# Patient Record
Sex: Female | Born: 1940 | Race: White | Hispanic: No | Smoking: Current every day smoker
Health system: Southern US, Community
[De-identification: ages and names within clinical notes are randomized; demographics above are authoritative.]

## PROBLEM LIST (undated history)

## (undated) DIAGNOSIS — I10 Essential (primary) hypertension: Secondary | ICD-10-CM

## (undated) DIAGNOSIS — I739 Peripheral vascular disease, unspecified: Secondary | ICD-10-CM

## (undated) DIAGNOSIS — I6529 Occlusion and stenosis of unspecified carotid artery: Secondary | ICD-10-CM

## (undated) DIAGNOSIS — I771 Stricture of artery: Secondary | ICD-10-CM

## (undated) DIAGNOSIS — Z720 Tobacco use: Secondary | ICD-10-CM

## (undated) DIAGNOSIS — E785 Hyperlipidemia, unspecified: Secondary | ICD-10-CM

## (undated) HISTORY — DX: Peripheral vascular disease, unspecified: I73.9

## (undated) HISTORY — DX: Essential (primary) hypertension: I10

## (undated) HISTORY — DX: Tobacco use: Z72.0

## (undated) HISTORY — DX: Hyperlipidemia, unspecified: E78.5

## (undated) HISTORY — DX: Occlusion and stenosis of unspecified carotid artery: I65.29

## (undated) HISTORY — DX: Stricture of artery: I77.1

---

## 2006-09-27 HISTORY — PX: OTHER SURGICAL HISTORY: SHX169

## 2006-12-30 ENCOUNTER — Ambulatory Visit (HOSPITAL_COMMUNITY): Admission: RE | Admit: 2006-12-30 | Discharge: 2006-12-30 | Payer: Self-pay | Admitting: Cardiology

## 2008-02-16 ENCOUNTER — Ambulatory Visit: Payer: Self-pay | Admitting: Surgery

## 2008-03-08 ENCOUNTER — Encounter: Admission: RE | Admit: 2008-03-08 | Discharge: 2008-03-08 | Payer: Self-pay | Admitting: Surgery

## 2008-03-08 ENCOUNTER — Ambulatory Visit: Payer: Self-pay | Admitting: Surgery

## 2008-04-01 ENCOUNTER — Encounter: Payer: Self-pay | Admitting: Surgery

## 2008-04-01 ENCOUNTER — Ambulatory Visit: Payer: Self-pay | Admitting: Surgery

## 2008-04-01 ENCOUNTER — Inpatient Hospital Stay (HOSPITAL_COMMUNITY): Admission: RE | Admit: 2008-04-01 | Discharge: 2008-04-08 | Payer: Self-pay | Admitting: Surgery

## 2008-04-01 HISTORY — PX: OTHER SURGICAL HISTORY: SHX169

## 2008-04-26 ENCOUNTER — Ambulatory Visit: Payer: Self-pay | Admitting: Surgery

## 2008-05-17 ENCOUNTER — Ambulatory Visit: Payer: Self-pay | Admitting: Surgery

## 2010-04-26 IMAGING — CT CT ANGIO AOBIFEM WO/W CM
2 of 7 series · 7 of 33 positions shown · IV contrast ([ID] OMNI 350)
Comparison: 12/30/2006

CLINICAL DATA: Right lower extremity claudication.  Previous CTA
demonstrated distal aortic occlusion, reconstituted external iliac
arteries and a right SFA occlusion.

CT ANGIOGRAPHY OF ABDOMINAL AORTA WITH ILIOFEMORAL RUNOFF
TECHNIQUE: Multidetector CT imaging of the abdomen, pelvis and
lower extremities was performed using the standard protocol during
bolus administration of intravenous contrast. Multiplanar CT image
reconstructions were obtained to evaluate the vascular anatomy.
Contrast: 165 ml Omnipaque 350 IV

[Series 5: runoff · axial · 0.78mm/px · z∈[-1068,-178]mm · 6 of 500 slices shown]
[im 72/500  soft-tissue]
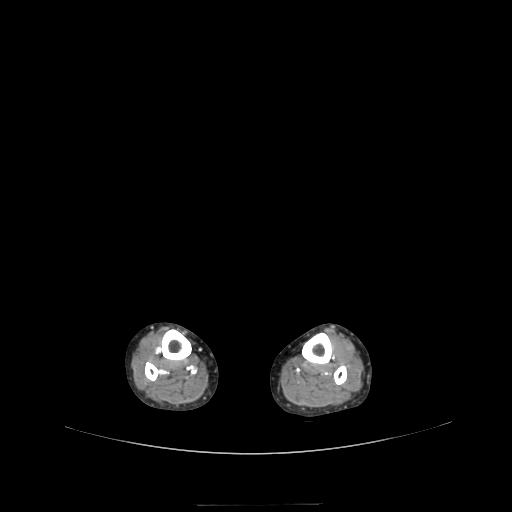
[im 143/500  bone]
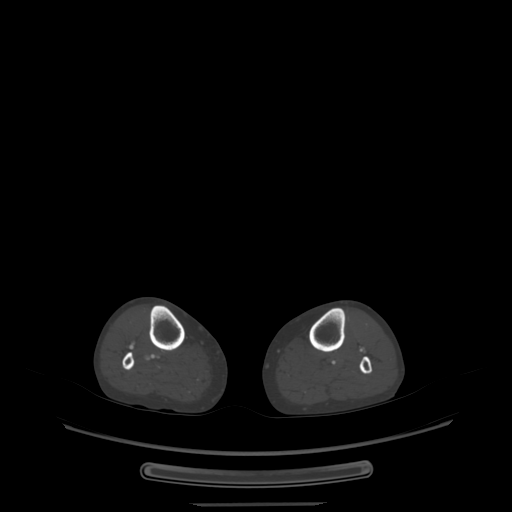
[im 214/500  soft-tissue]
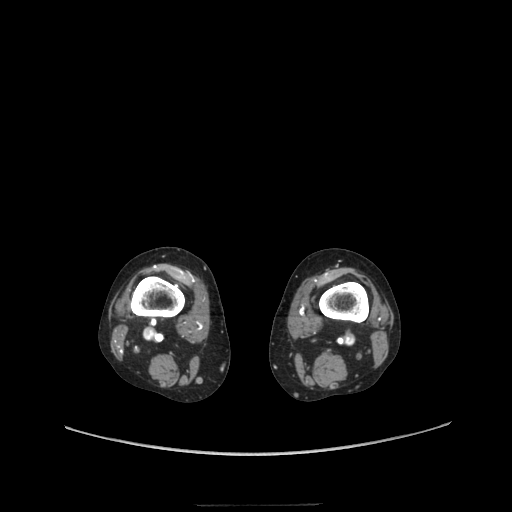
[im 286/500  bone]
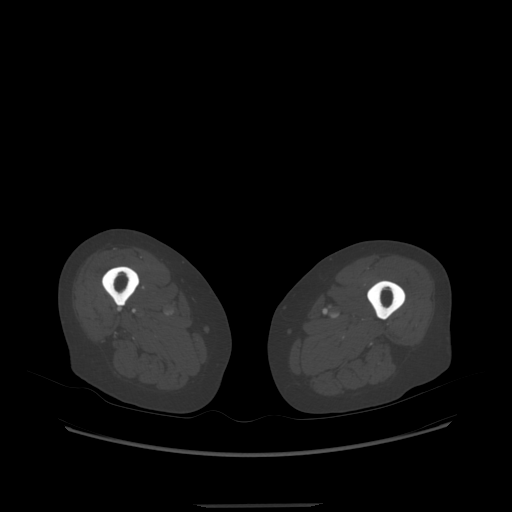
[im 357/500  soft-tissue]
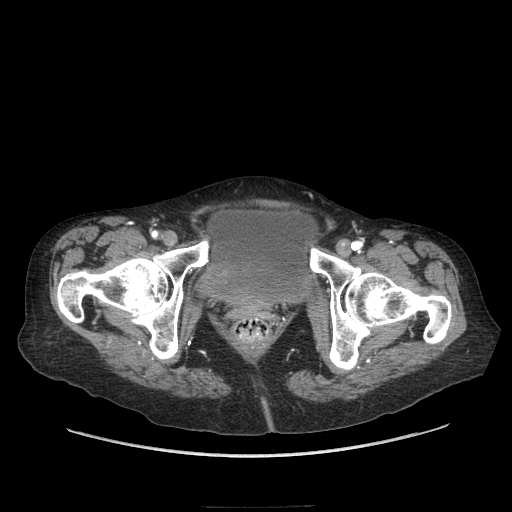
[im 428/500  bone]
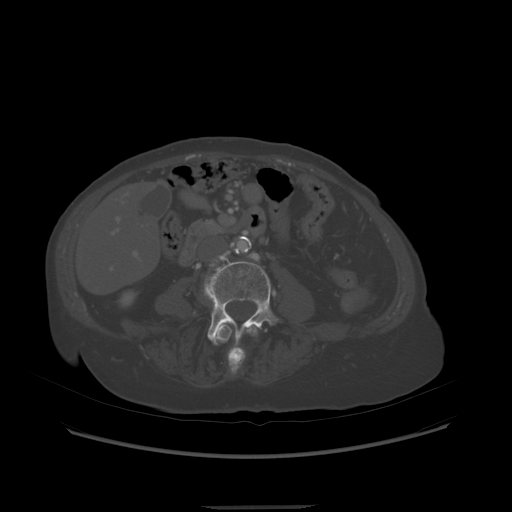

[Series 403: sag/legs · sagittal · 1.81mm/px · 1 of 134 slices shown]
[im 67/134  soft-tissue]
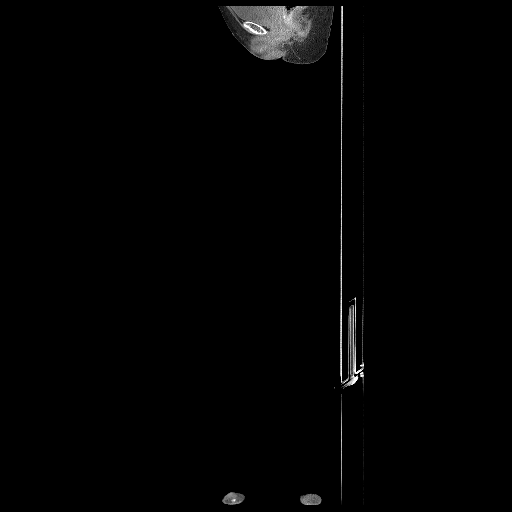

[7 of 33 positions shown; findings below may reference images not displayed]

FINDINGS: Aorta: Diffuse atherosclerotic disease.  No aneurysm.  Distal
aortic occlusion again demonstrated just above the aortic
bifurcation.  Single right renal artery is widely patent.  Heavily
calcified plaque at the origin of the left renal artery causes
approximately 50 - 70 % stenosis of the proximal vessel.  There is
a 70 - 80% stenosis at the origin of the celiac axis.  The superior
mesenteric artery and inferior mesenteric artery show normal
patency.

Right Lower Extermity: The distal external iliac artery is
reconstituted just above the inguinal ligament by prominent
inferior epigastric and circumflex iliac collaterals.  The common
femoral artery shows focal atherosclerotic plaque but no
significant narrowing.  There is stable chronic occlusion at the
origin of the right SFA with distal reconstitution at the abductor
hiatus.  The profunda femoral artery and collateral branches are
normally patent.  The reconstituted distal SFA initially is very
diseased and demonstrates approximately 50 - 70% diffuse stenosis.
The popliteal artery above the knee shows mild disease with an
eccentric 25 - 30% stenosis present.  Across the knee and below the
knee, the popliteal artery shows no significant disease.  Three-
vessel runoff is demonstrated into the foot with distal posterior
tibial and dorsalis pedis arteries of nearly equal caliber.

Left Lower Extremity: Similar level of external iliac
reconstitution just above the inguinal ligament by collateral
vessels.  The SFA is open on the left and demonstrates no
significant disease.  Profunda femoral artery and branches also
show no occlusive disease.  Three-vessel runoff is demonstrated
into the foot with dominant posterior tibial runoff present.  The
anterior tibial artery is open but smaller in caliber.
IMPRESSION: 1.  Stable appearance of chronic distal aortic occlusion.
2.  Stable appearance of reconstituted distal external iliac artery
on the right just above the inguinal ligament.  Chronic SFA
occlusion beginning at origin and continuing to the abductor hiatus
on the right.  Initial segment of reconstituted SFA shows diffuse
disease.  Three-vessel runoff present on the right.
3.  Distal external iliac artery reconstitution on the left just
above the inguinal ligament.  No significant SFA or profunda
femoral disease.  Three-vessel runoff demonstrated on the left.

## 2010-05-21 IMAGING — CR DG CHEST 1V PORT
1 series · 1 of 1 positions shown · non-contrast
Comparison: 1 day prior

CLINICAL DATA: Status post aortic bypass.

PORTABLE CHEST - 1 VIEW

[view not recorded]
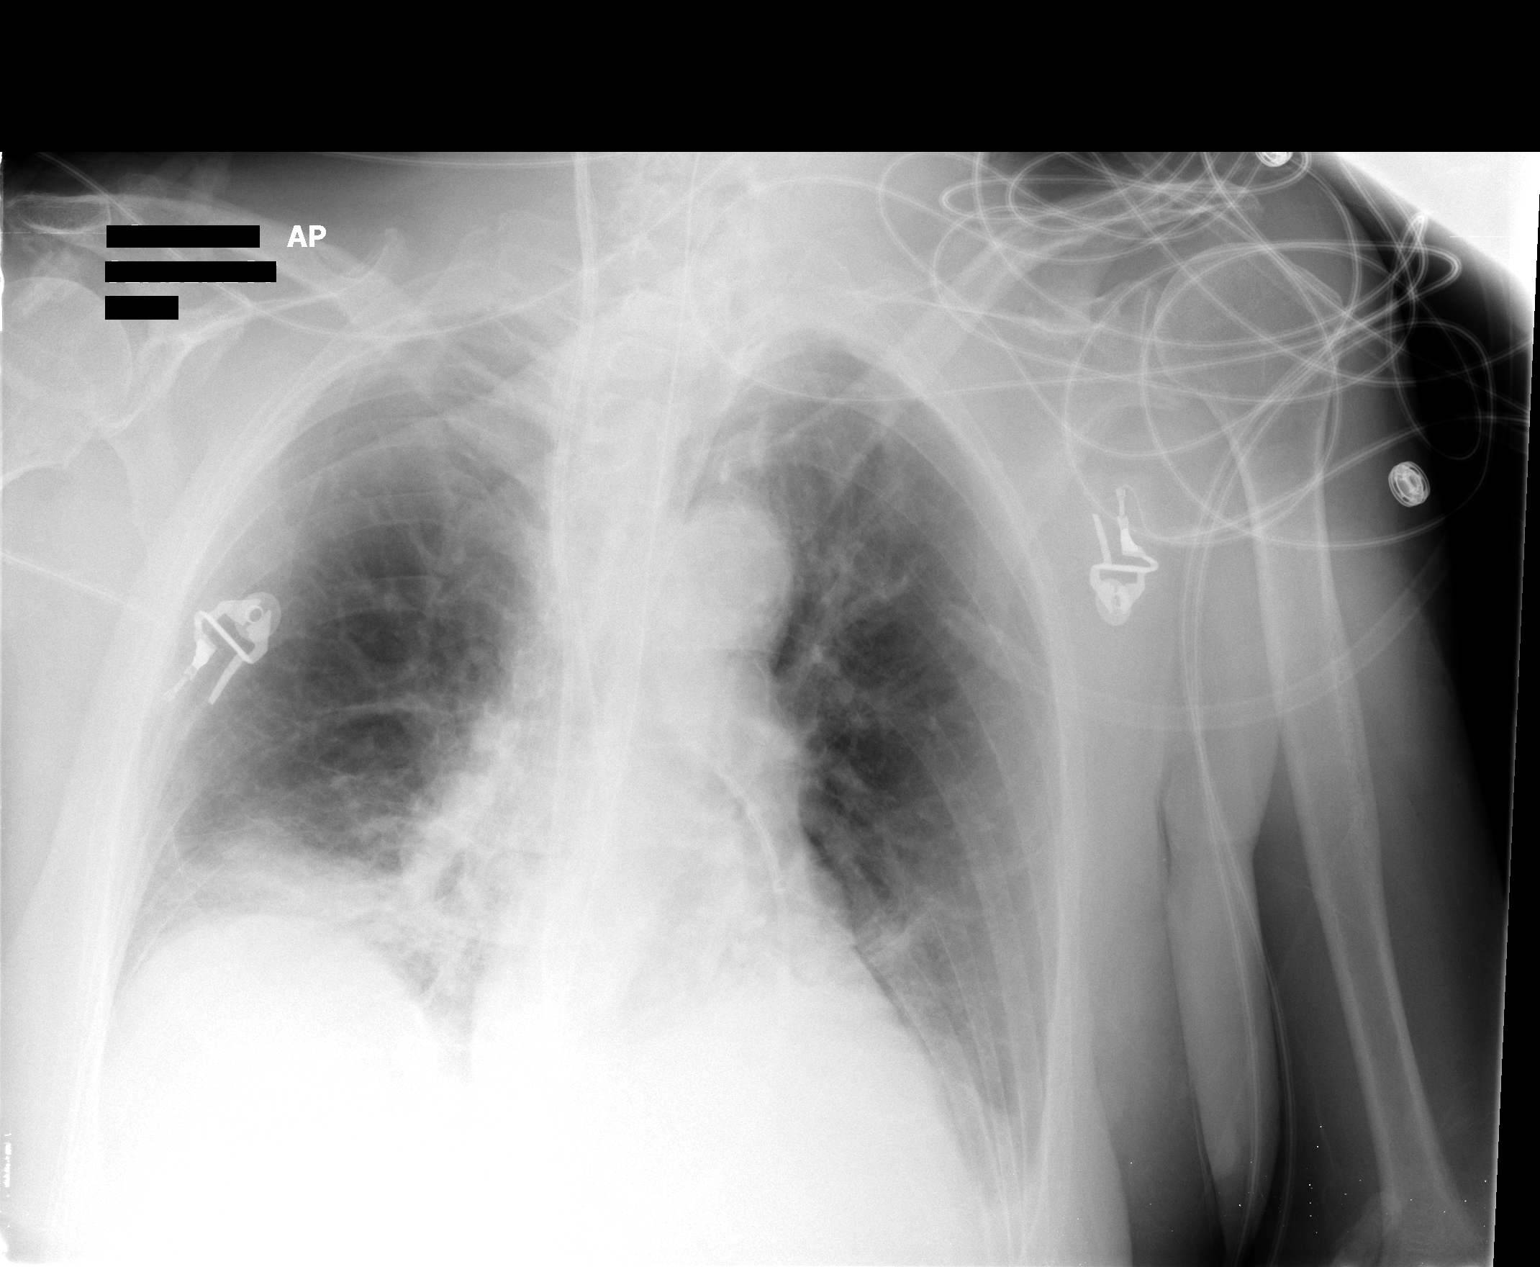

[1 of 1 positions shown; findings below may reference images not displayed]

FINDINGS: Nasogastric tube extends beyond the  inferior aspect of
the film.  Right IJ central line terminates at the low SVC.  Right
IJ Swan-Ganz catheter with tip at proximal right pulmonary artery.

The patient is rotated to the left.  Normal heart size.  Probable
small left pleural effusion. No pneumothorax.  Suspect mild
interstitial edema.  Similar left and increased right base air
space disease.
IMPRESSION: 1.  Slight worsening aeration with increased right base air space
disease which may represent atelectasis or developing infection.
2.  Suspect mild pulmonary venous congestion.
3.  Small left pleural effusion.

## 2010-09-13 HISTORY — PX: OTHER SURGICAL HISTORY: SHX169

## 2011-02-20 NOTE — Assessment & Plan Note (Signed)
OFFICE VISIT   Laura Arnold, Laura Arnold  DOB:  May 13, 1941                                       02/16/2008  [id number]#:[id number]   REASON FOR VISIT:  Aortic occlusion.   HISTORY:  This is a 70 year old female seen at the request of Dr. Yates Decamp for evaluation of bilateral claudication.  The patient had a CT  scan performed over a year ago which reveals infrarenal aortic occlusion  with reconstitution of bilateral common femoral arteries.  Her profunda  femoral arteries are patent.  However, her superficial femoral arteries  were occluded bilaterally.  They reconstituted the popliteal artery.  The patient states that she has had significant lifestyle changes due to  this.  She is very limited in her activity level.  She states that she  claudicates right side greater than left with just minimal ambulation.  She does not have any ulceration or open wounds at this time.  It does  not sound like she complains of rest pain.  However, she does have some  joint pain that keeps her up at night.   From a cardiovascular standpoint Dr. Jacinto Halim is managing her cardiac  issues.  She has an ejection fraction of approximately 78% and will be  cleared for intra-abdominal surgery.   REVIEW OF SYSTEMS:  GENERAL:  Positive for loss of appetite.  CARDIAC:  Negative.  PULMONARY:  Negative.  GI:  Negative.  GU:  Negative.  VASCULAR:  Pain in legs with walking.  NEURO:  Negative.  ORTHO:  Positive for arthritis.  PSYCH:  Positive for nervousness.  ENT:  Negative.  HEME: Negative.   PAST MEDICAL HISTORY:  Hypertension, hypercholesterolemia, tobacco  abuse, hypercholesterolemia.   FAMILY HISTORY:  Noncontributory.   SOCIAL HISTORY:  She is single.  She is retired.  She currently smokes  approximately half a pack a day.  She drinks approximately two glass of  wine per day.   MEDICATIONS:  Include Plavix 75 mg per day, cilostazol 100 mg twice  daily, ramipril 5 mg nightly,  simvastatin 40 mg nightly, and calcium 600  once a day.   ALLERGIES:  None.   PHYSICAL EXAMINATION:  Vital signs:  Blood pressure is 148/80, heart  rate is 85.  General:  Well-appearing in no acute distress.  HEENT:  Normocephalic, atraumatic.  Pupils are equal.  Sclerae are anicteric.  Neck is supple, no JVD.  Cardiovascular:  Regular.  Respirations  nonlabored.  Abdomen:  Soft, nontender.  No pulsatile mass.  No  hepatosplenomegaly.  Femoral pulses are nonpalpable.  Extremities are  warm and well perfused.  Pedal pulses are not palpable.  Skin is without  rash.  Neuro:  Cranial nerves 2-12 grossly intact.  Psych:  She is alert  and oriented x3.   ASSESSMENT/PLAN:  This is a 70 year old female with bilateral  claudication.   PLAN:  I had a long conversation regarding the risks and benefits of  proceeding with an aortobifemoral bypass graft.  She is still  indeterminate as to whether she wishes to proceed with this.  Regardless, I need further imaging to make sure that her disease has not  progressed over the course of the past year.  I am going to get a CT  angiogram of her abdomen and pelvis with bilateral lower extremity  runoff.  Also going to get a  noncontrasted CT scan of her chest should I  consider coming off her thoracic aorta as her infrarenal abdominal aorta  is heavily calcified.  She is going to follow up with me in 2 weeks,  once these studies have been obtained.  At that time, we will make the  decision as to whether or not she wishes to proceed with surgical  intervention.   Jorge Ny, MD  Electronically Signed   VWB/MEDQ  D:  02/16/2008  T:  02/17/2008  Job:  [id number]   cc:   Cristy Hilts. Jacinto Halim, MD

## 2011-02-20 NOTE — Discharge Summary (Signed)
Laura Arnold, Laura Arnold          ACCOUNT NO.:  [id number]   MEDICAL RECORD NO.:  [id number]          PATIENT TYPE:  INP   LOCATION:  2039                         FACILITY:  [facility]   PHYSICIAN:  Juleen China IV, MDDATE OF BIRTH:  Sep 14, 1941   DATE OF ADMISSION:  04/01/2008  DATE OF DISCHARGE:                               DISCHARGE SUMMARY   ANTICIPATED DATE OF DISCHARGE:  April 07, 2008.   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   DISCHARGE/SECONDARY DIAGNOSES:  1. 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  2. Postoperative hyperglycemia without history of diabetes mellitus.  3. Hypertension.  4. Hypercholesterolemia.  5. History of tobacco abuse.  6. Postoperative hypokalemia, supplemented.  7. Mild postoperative acute blood loss anemia.   ALLERGIES:  No known drug allergies.   PROCEDURES:  Aortobifemoral bypass graft with bilateral profundoplasty  using 14 x 7 bifurcated Dacron graft, reimplantation of the inferior  mesenteric artery into the left limb of the aortobifemoral graft.   SURGEON:  1. Charlena Cross, MD   BRIEF HISTORY:  Laura Arnold is a 70 year old Caucasian female who was  referred to Laura Arnold for bilateral claudication with known aortic  occlusion.  She is a patient Dr. Yates Decamp who cleared her for surgery  from a cardiac standpoint.  She has also known bilateral superficial  femoral artery occlusion.  Her infrarenal aorta was found to be heavily  calcified as was her thoracic aorta.  Laura Arnold recommended that she  undergo aortobifemoral bypass grafting to pulmonary circulation and thus  prefer claudication symptoms.   HOSPITAL COURSE:  [name] was electively admitted to [facility] on April 01, 2008.  She underwent the previously mentioned  procedure.  Following the procedure, she was taken to the  surgical  intensive care unit.  She remained there for postoperative day 2;  however, she did remain stable and was extubated; however, she did have  some mild desaturations when she was out of bed.  Her smoking history  was felt to be contributing factor as well as some pulmonary congestion  which was treated with Lasix.  Overall, she has had a relatively  uneventful hospital course.  Currently, she is on telemetry unit 2000.  She has been getting out of bed with assistance and was seen by physical  therapy who recommended home health physical therapy which has been  ordered.  She is currently tolerating a regular diet.  Her Foley  catheter has been removed.  Her incisions appear intact without signs of  infection.  Her feet remained well perfused.  She has required  supplementation for potassium which is most likely related to her  daughter diuretic therapy.  We do anticipate that if she continues to  tolerate regular diet and oxygen saturations are adequate without  supplemental oxygen, she is voiding and there are no new changes in her  status, she will be discharged home on postoperative day 6, April 07, 2008.  Of note, she is currently on 2 liters per  nasal cannula with  oxygen saturation in the low 90s.  If we are unable to successfully wean  her from oxygen, we will consider a chest x-ray.  Her white count,  however, is normal at 7.6.  Her other labs show a hemoglobin of 9.6,  hematocrit of 27.8, platelet count of 171, sodium 142, potassium 3.5  which was supplemented, BUN of 10, creatinine of 0.71, and blood glucose  of 102.  She also note her blood sugars were monitored during the  hospitalization.  On postoperative day 1, her blood sugar was up to 210  and she has been on NovoLog insulin sliding scale since; however, her  blood sugars have since been well controlled and in fact, her  preoperative blood glucose was 106 and she does not have any known  history of diabetes.  It is  felt she could have this followed up on an  outpatient basis by her family physician.  At the time of dictation, Ms.  Dinger remains in stable condition.  She is maintaining sinus rhythm  and has been afebrile with latest blood pressure of 117/71.   DISCHARGE INSTRUCTIONS:  She should follow a heart healthy diet,  increase her activity slowly, may shower and clean her incisions gently  with soap and water, and avoid driving or heavy lifting for 3 weeks.  She will see Laura Arnold in approximately 3 weeks with Doppler studies,  but should call sooner if she develops fever greater than 101, redness  or drainage from her incision sites or persistent nausea, vomiting, or  increased pain.  She has been getting Home Health Services, Delivery  Home Care, Home Health Nursing Aide, and physical therapy has been  arranged.  Home health nurse will remove her staples on April 15, 2008.   DISCHARGE MEDICATIONS:  1. Oxycodone 5 mg 1-2 tablet p.o. q.4 h p.r.n. pain.  2. Plavix 75 mg p.o. daily.  3. Ramipril 5 mg p.o. daily.  4. Simvastatin 40 mg p.o. daily.  5. Calcium 600 mg with vitamin D supplement daily.      Jerold Coombe, P.A.      Jorge Ny, MD  Electronically Signed    AWZ/MEDQ  D:  04/06/2008  T:  04/07/2008  Job:  [id number]   cc:   Cristy Hilts. Jacinto Halim, MD  V. Charlena Cross, MD

## 2011-02-20 NOTE — Assessment & Plan Note (Signed)
OFFICE VISIT   Laura Arnold, Laura Arnold  DOB:  Feb 12, 1941                                       04/26/2008  [id number]#:[id number]   REASON FOR VISIT:  Followup.   HISTORY:  This is a 70 year old female with aortic occlusion who  underwent aortobifemoral bypass graft on 04/01/2008.  This was done with  bilateral profundoplasty using a 14 x 7 bifurcated Dacron graft.  I also  reimplanted her inferior mesenteric artery in the left limb of her  graft.  The patient did well from her surgery and she comes back in  today for followup.  She has multiple minimal complaints such as leg  swelling and incisional discomfort, as well as difficulty in sitting up  out of bed.  Overall, however, I think she is much improved.  She is not  having much difficulty with walking from a claudication standpoint.  Her  GI tract is working.   EXAMINATION:  Her incisions are all well healed.  She does have  significant edema in both legs.   At this time, I think she is recovering very well from her surgery.  I  think if we can get her swelling down, she will be a lot happier.  I am  recommending that she did put into 20-30 mm compression stockings and to  see me back in 3 weeks.  She did have an ultrasound today which reveals  significantly improved blood flow.  She will be placed on our ultrasound  protocol.   Jorge Ny, MD  Electronically Signed   VWB/MEDQ  D:  04/26/2008  T:  04/27/2008  Job:  [id number]

## 2011-02-20 NOTE — Procedures (Signed)
LOWER EXTREMITY ARTERIAL EVALUATION-SINGLE LEVEL   INDICATION:  Follow-up evaluation of aortobifemoral BPG.  Patient has  experienced bilateral calf and ankle swelling since surgery, left >  right, which she states seems to be resolving.  Patient's legs are warm  to the touch, and she is experiencing general discomfort.   HISTORY:  Diabetes:  No.  Cardiac:  No.  Hypertension:  Yes.  Smoking:  Yes, one-half pack per week.  Previous Surgery:  Aortobifemoral BPG with bilateral profundoplasty and  reimplantation of the IMA into the left limb of the bypass graft on  04/01/08 by Dr. Myra Gianotti IV.   RESTING SYSTOLIC PRESSURES: (ABI)                          RIGHT                LEFT  Brachial:               204                  198  Anterior tibial:        140                  204  Posterior tibial:       120                  204 (1.0)  Peroneal:               144 (0.71)  DOPPLER WAVEFORM ANALYSIS:  Anterior tibial:        Biphasic (hyperemic) Biphasic  Posterior tibial:       Monophasic (hyperemic)  Biphasic  Peroneal:               Biphasic  (hyperemic)   PREVIOUS ABI'S:  Date:  RIGHT:  LEFT:   IMPRESSION:  1. Right ankle brachial index is consistent with mild-moderate      arterial compromise with hyperemic arterial waveforms noted at the      anterior tibial, posterior tibial, and peroneal arteries.  2. Normal left ankle brachial index.      ___________________________________________  V. Charlena Cross, MD   PB/MEDQ  D:  04/26/2008  T:  04/26/2008  Job:  [id number]

## 2011-02-20 NOTE — Assessment & Plan Note (Signed)
OFFICE VISIT   DORE, OQUIN  DOB:  [birth year]                                       03/08/2008  EAVWU#:[id number]   REASON FOR VISIT:  Followup.   HISTORY:  This is a 70 year old female I am seeing at the request of Dr.  Yates Decamp for evaluation of bilateral claudication.  She came to my  office on 02/16/2008.  She had a CT scan performed over a year ago,  which showed infrarenal aortic occlusion with reconstitution of  bilateral common femoral arteries.  She continues to have significant  claudication and is considering surgical revascularization.  Her  symptoms are lifestyle limiting.  Her right side is more significant  than the left.  She does not have any open wounds at this time.  has  been cleared by Dr. Jacinto Halim for aortobifemoral bypass graft.   REVIEW OF SYSTEMS:  GENERAL:  Positive for loss of appetite.  CARDIAC:  Negative.  PULMONARY:  Negative.  GU:  Negative.  GI:  Negative.  VASCULAR:  Pain in legs with walking.  NEURO:  Negative.  ORTHO:  Positive for arthritis.  PSYCH:  Positive for nervousness.  ENT:  Negative.  HEME:  Negative.   PAST MEDICAL HISTORY:  Hypertension, hypercholesterolemia.  Tobacco  abuse.  Hypercholesterolemia.   FAMILY HISTORY:  Noncontributory.   SOCIAL HISTORY:  She is single and retired.  Currently smokes  approximately 1/2 a pack a day, drinks approximately two glasses of wine  per day.   MEDICATIONS:  Include Plavix, cilostazol, ramipril, simvastatin and  calcium.   ALLERGIES:  None.   PHYSICAL EXAMINATION:  Blood pressure is 163/96, pulse 114.  General:  She is well-appearing in no acute distress.  Cardiovascular:  Regular  rate and rhythm.  Respirations are nonlabored.  Abdomen:  Soft,  nontender.  Extremities:  Warm and well-perfused.   DIAGNOSTIC STUDIES:  The patient had a CT scan performed of her chest,  abdomen, and pelvis.  This reveals significant atherosclerotic disease  in her  infrarenal aorta, which is occluded.  Her thoracic aorta is also  heavily calcified.  She has reconstitution of her femoral arteries at  the level of the femoral head.  Her superficial femoral arteries are  occluded.   ASSESSMENT/PLAN:  Bilateral claudication.  I have discussed with the  patient and the family proceeding with an aortobifemoral bypass graft.  The risks and benefits were discussed, which include bleeding,  infection, risk of heart attack, risk of pulmonary complication, risk of  death.  The patient understands these risks and feels that her symptoms  are severe enough that her lifestyle would be significantly improved by  proceeding with surgery.  I have scheduled her operation for Thursday,  June 25th.  I have recommended that she stop her Plavix and her  cilostazol 1 week prior to her operation.   Jorge Ny, MD  Electronically Signed   VWB/MEDQ  D:  03/08/2008  T:  03/09/2008  Job:  [id number]   cc:   Cristy Hilts. Jacinto Halim, MD

## 2011-02-20 NOTE — Assessment & Plan Note (Signed)
OFFICE VISIT   Laura Arnold, Laura Arnold  DOB:  05/05/1941                                       05/17/2008  EAVWU#:[id number]   HISTORY:  This is a 70 year old female with some aortic occlusion who  underwent aortobifemoral bypass graft on 04/01/2008.  She also had  bilateral profundoplasty and reimplantation of inferior mesenteric  artery.  She did well following her surgery has had multiple minor  complaints since that time, including leg swelling incisional discomfort  as well as pain and trouble getting out of bed.  Overall however, she is  much better.  Most recently put her in compression hose to help with her  swelling.  She comes back in today for followup.  She says that she is  doing much better at this time.  Swelling is almost resolved.  She does  have some burning around her incision but feels pretty good.   I will plan on having the patient come back to see me in February for  repeat evaluation.  I think she is doing very well from her operation at  this time.   Jorge Ny, MD  Electronically Signed   VWB/MEDQ  D:  05/17/2008  T:  05/18/2008  Job:  [id number]   cc:   Cristy Hilts. Jacinto Halim, MD

## 2011-02-20 NOTE — Op Note (Signed)
NAMEEVEREST, HACKING          ACCOUNT NO.:  [id number]   MEDICAL RECORD NO.:  [id number]          PATIENT TYPE:  INP   LOCATION:  2311                         FACILITY:  [facility]   PHYSICIAN:  Juleen China IV, MDDATE OF BIRTH:  December 03, 1940   DATE OF PROCEDURE:  04/01/2008  DATE OF DISCHARGE:                               OPERATIVE REPORT   PREOPERATIVE DIAGNOSIS:  Bilateral lower extremity claudication.   POSTOPERATIVE DIAGNOSIS:  Bilateral lower extremity claudication.   PROCEDURES PERFORMED:  1. Aortobifemoral bypass graft with bilateral profundoplasty using 14      x 7 bifurcated Dacron graft.  2. Reimplantation of the inferior mesenteric artery into the left limb      of the aortobifemoral.   TYPE OF ANESTHESIA:  General.   ESTIMATED BLOOD LOSS:  500 mL.   COMPLICATIONS:  None.   DRAINS:  None.   SPECIMENS:  None.   INDICATIONS:  This is a 70 year old female who had seen for bilateral  claudication with a known aortic occlusion.  She is the patient of Dr.  Yates Decamp and has been cleared for surgery.  She has known bilateral  superficial femoral artery occlusion.  Her infrarenal aorta is heavily  calcified as is her thoracic aorta.  Risks and benefits of the procedure  were discussed with the patient in detail in the clinic note.  Informed  consent was signed.   SURGEON:  1. Durene Cal IV, MD.   ASSISTANTS:  1. Di Kindle. Edilia Bo, M.D.  2. Janetta Hora. Fields, MD.   PROCEDURE:  The patient was identified in the holding area and taken to  the room #6.  She was placed supine on the table.  General endotracheal  anesthesia was administered.  A time-out was called and perioperative  antibiotics were administered.  The patient was prepped and draped in  standard sterile fashion.  I first began in the groin, Dr. Darrick Penna was  present to expose the left groin.  Longitudinal incisions were made  below the inguinal ligament.  Femoral pulses were not palpable.   Cautery  was used to dissect through the subcutaneous tissue.  The femoral sheath  was then exposed sharply.  Common femoral artery, profunda femoral, and  superficial femoral artery were all individually isolated and encircled  with vessel loops.  Crossing vein in the inguinal ligament was divided  bilaterally between 0 silk ties.  Light tension was placed in the groin,  and attention was turned towards the abdomen.  Midline intraabdominal  incision was made from the xiphoid down to midway between the umbilicus  and the pubis.  Cautery was used to dissect down to the midline  abdominal fascia which was opened sharply.  The peritoneal cavity was  then entered under direct vision and the incision was opened throughout  its length with Bovie cautery.  The abdomen was inspected.  There were  dense adhesions in the right lower quadrant which were taken down  sharply.  A normal appendix was visualized.  The colon appeared normal  as did the liver.  NG tube was palpated in the stomach.  Next, first a  Balfour retractor was placed and the transverse colon was elevated and  cephalad.  An Omni tract was used for exposure.  The small bowel was  pulled to the patient's right and the ligament of Treitz was taken down.  The bowel was placed outside the abdomen and packed in the moist laps.  The inferior mesenteric vein was divided.  Cautery was used to dissect  down to the aorta.  The aorta was heavily calcified.  There was a large  IMA present.  The aorta was then dissected out up to the renal vein  which was mobilized cephalad.  There was a heavily calcified ridge just  below the origin of each renal artery.  Just below this was a soft spot  that was adequate to be clamped.  The aorta was circumferentially  dissected out in this region and umbilical tape was passed.  Next, the  inferior mesenteric artery was encircled with a red vessel loop.  There  was a second soft spot just above the inferior  mesenteric artery which  was felt would be again acceptable placed to sew in her graft.  Next,  the aorta was exposed down to both femoral arteries.  The tunnel was  created posterior to the ureter bilaterally connecting the inguinal  incision and the abdominal incision.  Umbilical tape was then passed.   At this point, the patient was given systemic heparin and 25 mg of  mannitol.  A Harken clamp was placed on the infrarenal aorta.  An aortic  clamp was placed below distal to the inferior mesenteric artery, and  inferior mesenteric artery vessel loop was tightened.  An #11 blade was  used to make an arteriotomy.  The aorta was then transected at this  level.  There was still bleeding from the proximal clamp, and I elected  to place an aortic clamp above the Harken.  This created a tear in the  aorta which seemed to stop initially with manual compression.  Patent  lumbar arteries were clipped.  This improved hemostasis.  Next, the  distal aorta was oversewn with a 3-0 Prolene suture between a Schirmer  felt strip.  This was done with horizontal mattress followed by a  baseball stitch.  Once this was completed, the distal aortic clamp was  removed and hemostasis was excellent.  Next, a 14-mm Dacron graft was  brought on to the field and cut appropriately.  Running anastomosis was  created with a felt strip using 3-0 Prolene.  Once the anastomosis was  completed, it was tested and found to be hemostatic.  Next, the graft  was brought to the previously created tunnel down into the each groin.  Dr. Edilia Bo assisted with the left groin anastomosis.  Vascular clamps  were used to occlude the common femoral and profunda femoral and  superficial femoral arteries.  Longitudinal arteriotomies were made on  the common and the distal common femoral artery extending onto the  bilateral profunda femoral arteries.  At this point, each one of the  graft was flushed.  They were cut to the appropriate  length and beveled  to meet the size of the arteriotomies bilaterally.  An end-to-side  anastomosis was created with bilateral profundoplasties using a running  5-0 Prolene suture.  Prior to completion of each anastomosis, the aortic  graft was flushed as well as back bleeding from the profunda femoral  arteries bilaterally.  Both superficial femoral arteries were occluded.  Each leg was given back individually.  Once this was completed, a  Doppler was used to evaluate bilateral profunda femoral arteries which  had excellent Doppler signals.  I then inspected the inferior mesenteric  artery which had very meager backbleeding.  Given the patient's anatomy,  I elected to reimplant the inferior mesenteric artery.  The left limb of  the graft was occluded.  A #4 punch was used to create a graftotomy in  the left limb of the graft.  The inferior mesenteric artery was  spatulated and anastomosed to the left limb of the graft using a running  6-0 Prolene.  Once this was completed, the clamps were released.  The  anastomosis was hemostatic.  There was a good Doppler signal in the  inferior mesenteric artery splint.  At this point, I reevaluated the  proximal anastomosis which was hemostatic.  The crack in the aorta  proximal to this required an adventitial 6-0 Prolene suture with a  single pledget.  This provided adequate hemostasis to the tear in the  aorta.  At this point in time, the patient's heparin was reversed with  50 mg of protamine.  The abdomen was then copiously irrigated.  The  retroperitoneum was closed using running 2-0 Vicryl.  The bowel was then  placed back into its anatomic position.  The abdomen was then irrigated  again.  The fascia was then closed with a running #1 PDS suture.  The  skin was  closed with staples.  Next, the groins were irrigated.  The groins were  closed with running 2-0 and 3-0 Vicryl and skin was closed with staples.  Sterile dressings were applied.  The  patient tolerated the procedure  well and was taken to the recovery room in stable condition.           ______________________________  V. Charlena Cross, MD  Electronically Signed     VWB/MEDQ  D:  04/02/2008  T:  04/03/2008  Job:  [id number]

## 2011-02-20 NOTE — Discharge Summary (Signed)
NAMEDENNISSE, SWADER          ACCOUNT NO.:  [id number]   MEDICAL RECORD NO.:  [id number]          PATIENT TYPE:  INP   LOCATION:  2039                         FACILITY:  [facility]   PHYSICIAN:  Juleen China IV, MDDATE OF BIRTH:  09/20/1941   DATE OF ADMISSION:  04/01/2008  DATE OF DISCHARGE:  04/08/2008                               DISCHARGE SUMMARY   ADDENDUM  Ms. Watt was held over from delayed discharge yesterday due to  weakness.  She is ready for discharge today.  She was evaluated by  Cardiovascular Surgery on a social visit.  They did recommend the  addition of Lopressor 12.5 mg twice a day.  She did receive a  prescription for Lopressor 12.5 mg.  She is hemodynamically stable.  She  was walking and improving with physical therapy.  Her wounds are healing  well and she is discharged home.   CONDITION ON DISCHARGE:  Stable and improving.      Wilmon Arms, PA      V. Charlena Cross, MD  Electronically Signed    KEL/MEDQ  D:  04/08/2008  T:  04/09/2008  Job:  [id number]   cc:   Jorge Ny, MD

## 2011-07-05 LAB — POCT I-STAT 7, (LYTES, BLD GAS, ICA,H+H)
Bicarbonate: 25 — ABNORMAL HIGH
Bicarbonate: 25.5 — ABNORMAL HIGH
HCT: 29 — ABNORMAL LOW
HCT: 32 — ABNORMAL LOW
Hemoglobin: 10.9 — ABNORMAL LOW
Hemoglobin: 9.9 — ABNORMAL LOW
Operator id: 230421
Patient temperature: 36
Potassium: 4
TCO2: 26
pCO2 arterial: 39.3
pCO2 arterial: 45
pH, Arterial: 7.353
pH, Arterial: 7.415 — ABNORMAL HIGH
pO2, Arterial: 92

## 2011-07-05 LAB — CBC
HCT: 26.1 — ABNORMAL LOW
HCT: 28.5 — ABNORMAL LOW
HCT: 32.9 — ABNORMAL LOW
Hemoglobin: 9.6 — ABNORMAL LOW
Hemoglobin: 9.8 — ABNORMAL LOW
Hemoglobin: 9.9 — ABNORMAL LOW
MCHC: 33.5
MCHC: 33.7
MCHC: 33.7
MCHC: 34.4
MCHC: 34.6
MCHC: 34.6
MCV: 90.2
MCV: 90.5
MCV: 90.5
Platelets: 123 — ABNORMAL LOW
Platelets: 131 — ABNORMAL LOW
Platelets: 147 — ABNORMAL LOW
Platelets: 170
Platelets: 171
Platelets: 249
RBC: 3.25 — ABNORMAL LOW
RDW: 13.1
RDW: 13.4
RDW: 13.4
RDW: 13.9
RDW: 13.9
RDW: 14
WBC: 12.9 — ABNORMAL HIGH

## 2011-07-05 LAB — POCT I-STAT 3, ART BLOOD GAS (G3+)
Bicarbonate: 25.1 — ABNORMAL HIGH
TCO2: 26
pCO2 arterial: 45.5 — ABNORMAL HIGH
pH, Arterial: 7.35

## 2011-07-05 LAB — POCT I-STAT 4, (NA,K, GLUC, HGB,HCT): Operator id: 117072

## 2011-07-05 LAB — URINALYSIS, ROUTINE W REFLEX MICROSCOPIC
Glucose, UA: 100 — AB
Protein, ur: NEGATIVE

## 2011-07-05 LAB — COMPREHENSIVE METABOLIC PANEL
ALT: 21
AST: 18
AST: 18
Albumin: 2.3 — ABNORMAL LOW
Albumin: 4.2
Calcium: 7.5 — ABNORMAL LOW
Calcium: 9.7
Creatinine, Ser: 0.75
GFR calc Af Amer: >60
GFR calc Af Amer: >60
GFR calc non Af Amer: >60
Glucose, Bld: 106 — ABNORMAL HIGH
Sodium: 140
Total Protein: 6.6

## 2011-07-05 LAB — BASIC METABOLIC PANEL
BUN: 12
BUN: 12
BUN: 9
CO2: 23
CO2: 25
CO2: 26
CO2: 27
Calcium: 8 — ABNORMAL LOW
Calcium: 8 — ABNORMAL LOW
Calcium: 8.3 — ABNORMAL LOW
Chloride: 105
Creatinine, Ser: 0.71
GFR calc Af Amer: >60
GFR calc Af Amer: >60
GFR calc non Af Amer: >60
GFR calc non Af Amer: >60
GFR calc non Af Amer: >60
GFR calc non Af Amer: >60
Glucose, Bld: 102 — ABNORMAL HIGH
Glucose, Bld: 109 — ABNORMAL HIGH
Glucose, Bld: 120 — ABNORMAL HIGH
Glucose, Bld: 182 — ABNORMAL HIGH
Potassium: 4.2
Potassium: 4.2
Sodium: 138
Sodium: 142

## 2011-07-05 LAB — POCT I-STAT GLUCOSE: Glucose, Bld: 152 — ABNORMAL HIGH

## 2011-07-05 LAB — POCT I-STAT, CHEM 8
BUN: 11
Chloride: 101
Creatinine, Ser: 0.8
Glucose, Bld: 100 — ABNORMAL HIGH
Potassium: 3.7

## 2011-07-05 LAB — APTT: aPTT: 30

## 2011-07-05 LAB — BLOOD GAS, ARTERIAL
Acid-base deficit: 0.8
Acid-base deficit: 2.7 — ABNORMAL HIGH
Drawn by: 206361
FIO2: 0.21
O2 Content: 7
O2 Saturation: 96.8
O2 Saturation: 97.9
TCO2: 23.2
pO2, Arterial: 111 — ABNORMAL HIGH

## 2011-07-05 LAB — TYPE AND SCREEN

## 2011-07-05 LAB — URINE MICROSCOPIC-ADD ON

## 2011-07-05 LAB — PROTIME-INR: Prothrombin Time: 12.3

## 2012-01-18 HISTORY — PX: OTHER SURGICAL HISTORY: SHX169

## 2012-08-21 ENCOUNTER — Encounter: Payer: Self-pay | Admitting: Vascular Surgery

## 2013-07-01 ENCOUNTER — Ambulatory Visit: Payer: Self-pay | Admitting: Cardiovascular Disease

## 2013-07-03 ENCOUNTER — Ambulatory Visit: Payer: Self-pay | Admitting: Cardiovascular Disease

## 2013-07-03 ENCOUNTER — Encounter: Payer: Self-pay | Admitting: Physician Assistant

## 2013-07-03 DIAGNOSIS — E785 Hyperlipidemia, unspecified: Secondary | ICD-10-CM

## 2013-07-03 DIAGNOSIS — I771 Stricture of artery: Secondary | ICD-10-CM | POA: Insufficient documentation

## 2013-07-03 DIAGNOSIS — Z720 Tobacco use: Secondary | ICD-10-CM | POA: Insufficient documentation

## 2013-07-03 DIAGNOSIS — I10 Essential (primary) hypertension: Secondary | ICD-10-CM

## 2013-07-03 DIAGNOSIS — I6529 Occlusion and stenosis of unspecified carotid artery: Secondary | ICD-10-CM | POA: Insufficient documentation

## 2013-07-03 DIAGNOSIS — I6523 Occlusion and stenosis of bilateral carotid arteries: Secondary | ICD-10-CM

## 2013-07-03 DIAGNOSIS — I739 Peripheral vascular disease, unspecified: Secondary | ICD-10-CM | POA: Insufficient documentation

## 2013-07-08 ENCOUNTER — Ambulatory Visit (INDEPENDENT_AMBULATORY_CARE_PROVIDER_SITE_OTHER): Payer: Medicare Other | Admitting: Cardiovascular Disease

## 2013-07-08 ENCOUNTER — Encounter: Payer: Self-pay | Admitting: Cardiovascular Disease

## 2013-07-08 VITALS — BP 142/80 | HR 52 | Ht 67.5 in | Wt 149.0 lb

## 2013-07-08 DIAGNOSIS — E785 Hyperlipidemia, unspecified: Secondary | ICD-10-CM

## 2013-07-08 DIAGNOSIS — Z720 Tobacco use: Secondary | ICD-10-CM

## 2013-07-08 DIAGNOSIS — I739 Peripheral vascular disease, unspecified: Secondary | ICD-10-CM

## 2013-07-08 DIAGNOSIS — I6529 Occlusion and stenosis of unspecified carotid artery: Secondary | ICD-10-CM

## 2013-07-08 DIAGNOSIS — I658 Occlusion and stenosis of other precerebral arteries: Secondary | ICD-10-CM

## 2013-07-08 DIAGNOSIS — F17200 Nicotine dependence, unspecified, uncomplicated: Secondary | ICD-10-CM

## 2013-07-08 DIAGNOSIS — I10 Essential (primary) hypertension: Secondary | ICD-10-CM

## 2013-07-08 DIAGNOSIS — I6523 Occlusion and stenosis of bilateral carotid arteries: Secondary | ICD-10-CM

## 2013-07-08 MED ORDER — METOPROLOL TARTRATE 25 MG PO TABS: 25.0000 mg | ORAL_TABLET | Freq: Two times a day (BID) | ORAL | Status: DC

## 2013-07-08 MED ORDER — CLOPIDOGREL BISULFATE 75 MG PO TABS: 75.0000 mg | ORAL_TABLET | Freq: Every day | ORAL | Status: DC

## 2013-07-08 MED ORDER — SIMVASTATIN 40 MG PO TABS: 40.0000 mg | ORAL_TABLET | Freq: Every evening | ORAL | Status: DC

## 2013-07-08 MED ORDER — RAMIPRIL 10 MG PO CAPS: 10.0000 mg | ORAL_CAPSULE | Freq: Every day | ORAL | Status: DC

## 2013-07-08 NOTE — Assessment & Plan Note (Signed)
Well-controlled on current medications 

## 2013-07-08 NOTE — Assessment & Plan Note (Signed)
Followed by duplex ultrasound in our office. She is overdue to have these done. She is on aspirin and Plavix. Will recheck Dopplers on her.

## 2013-07-08 NOTE — Assessment & Plan Note (Signed)
On statin therapy. She had a lipid profile performed by her PCP earlier this year.

## 2013-07-08 NOTE — Patient Instructions (Addendum)
  We will see you back in follow up in 1 year with Dr Allyson Sabal  Dr Allyson Sabal has ordered a carotid doppler

## 2013-07-08 NOTE — Assessment & Plan Note (Signed)
Status post aortobifemoral bypass grafting with reimplantation of her inferior mesenteric artery and bilateral profundoplasty performed by Dr. Durene Cal . They follow her Doppler studies as an outpatient. She denies claudication.

## 2013-07-08 NOTE — Progress Notes (Signed)
07/08/2013 Laura Arnold   [birth year]  [id number]  Primary Physician No PCP Per Patient Primary Cardiologist: Runell Gess MD [name]   HPI:  The patient is a 72 year old, mildly overweight, divorced Caucasian female, mother of 1, grandmother to 1 grandchild who I last saw August 24, 2010. She has a history of hypertension, hyperlipidemia and continued tobacco abuse of 1/2 pack per day. She has PVOD status post aorto-bi-femoral bypass grafting with reimplantation of her IMA and bilateral profundoplasty. The claudication improved after that time. She denies chest pain or shortness of breath. A 2D echo in 2007 was normal and Myoview performed September 13, 2010, was nonischemic. Her most recent carotid Dopplers performed January 18, 2011, revealed mild right and moderate left ICA stenosis. 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 I saw her back February 2013. Since that time she denies chest pain, shortness of breath or claudication. She does continue to smoke and is recalcitrant to risk factor modification.     Current Outpatient Prescriptions  Medication Sig Dispense Refill  . aspirin 81 MG tablet Take 81 mg by mouth daily.      . clopidogrel (PLAVIX) 75 MG tablet Take 75 mg by mouth daily.      . metoprolol tartrate (LOPRESSOR) 25 MG tablet Take 25 mg by mouth 2 (two) times daily.      . Multiple Vitamin (MULTIVITAMIN) tablet Take 1 tablet by mouth daily.      . ramipril (ALTACE) 10 MG capsule Take 10 mg by mouth daily.      . simvastatin (ZOCOR) 40 MG tablet Take 40 mg by mouth every evening.       No current facility-administered medications for this visit.    No Known Allergies  History   Social History  . Marital Status: Divorced    Spouse Name: N/A    Number of Children: N/A  . Years of Education: N/A   Occupational History  . Not on file.   Social History Main Topics    . Smoking status: Current Every Day Smoker -- 0.50 packs/day    Types: Cigarettes  . Smokeless tobacco: Not on file  . Alcohol Use: Not on file  . Drug Use: Not on file  . Sexual Activity: Not on file   Other Topics Concern  . Not on file   Social History Narrative  . No narrative on file     Review of Systems: General: negative for chills, fever, night sweats or weight changes.  Cardiovascular: negative for chest pain, dyspnea on exertion, edema, orthopnea, palpitations, paroxysmal nocturnal dyspnea or shortness of breath Dermatological: negative for rash Respiratory: negative for cough or wheezing Urologic: negative for hematuria Abdominal: negative for nausea, vomiting, diarrhea, bright red blood per rectum, melena, or hematemesis Neurologic: negative for visual changes, syncope, or dizziness All other systems reviewed and are otherwise negative except as noted above.    Blood pressure 142/80, pulse 52, height 5' 7.5" (1.715 m), weight 149 lb (67.586 kg).  General appearance: alert and no distress Neck: no adenopathy, no JVD, supple, symmetrical, trachea midline, thyroid not enlarged, symmetric, no tenderness/mass/nodules and bilateral carotid bruits Lungs: clear to auscultation bilaterally Heart: regular rate and rhythm, S1, S2 normal, no murmur, click, rub or gallop Extremities: extremities normal, atraumatic, no cyanosis or edema  EKG sinus bradycardia at 52 without ST or T wave changes  ASSESSMENT AND PLAN:   Peripheral artery disease: Aortobifemoral bypass  grafting with reimplantation of her IMA and bilateral profundoplasty. Status post aortobifemoral bypass grafting with reimplantation of her inferior mesenteric artery and bilateral profundoplasty performed by Dr. Durene Cal . They follow her Doppler studies as an outpatient. She denies claudication.  Carotid artery stenosis Followed by duplex ultrasound in our office. She is overdue to have these done. She is  on aspirin and Plavix. Will recheck Dopplers on her.  HTN (hypertension) Well-controlled on current medications  HLD (hyperlipidemia) On statin therapy. She had a lipid profile performed by her PCP earlier this year.      Runell Gess MD FACP,FACC,FAHA, [facility] 07/08/2013 10:18 AM

## 2013-07-15 ENCOUNTER — Encounter (HOSPITAL_COMMUNITY): Payer: Medicare Other

## 2013-07-21 ENCOUNTER — Telehealth (HOSPITAL_COMMUNITY): Payer: Self-pay | Admitting: *Deleted

## 2013-07-24 ENCOUNTER — Encounter: Payer: Self-pay | Admitting: Cardiovascular Disease

## 2013-11-20 ENCOUNTER — Telehealth (HOSPITAL_COMMUNITY): Payer: Self-pay | Admitting: *Deleted

## 2014-07-13 ENCOUNTER — Ambulatory Visit (INDEPENDENT_AMBULATORY_CARE_PROVIDER_SITE_OTHER): Payer: Medicare Other | Admitting: Cardiovascular Disease

## 2014-07-13 ENCOUNTER — Encounter: Payer: Self-pay | Admitting: Cardiovascular Disease

## 2014-07-13 VITALS — BP 148/78 | HR 56 | Ht 67.0 in | Wt 141.8 lb

## 2014-07-13 DIAGNOSIS — E785 Hyperlipidemia, unspecified: Secondary | ICD-10-CM

## 2014-07-13 DIAGNOSIS — I6523 Occlusion and stenosis of bilateral carotid arteries: Secondary | ICD-10-CM

## 2014-07-13 DIAGNOSIS — R0989 Other specified symptoms and signs involving the circulatory and respiratory systems: Secondary | ICD-10-CM

## 2014-07-13 DIAGNOSIS — I10 Essential (primary) hypertension: Secondary | ICD-10-CM

## 2014-07-13 DIAGNOSIS — Z79899 Other long term (current) drug therapy: Secondary | ICD-10-CM

## 2014-07-13 MED ORDER — SIMVASTATIN 40 MG PO TABS: 40.0000 mg | ORAL_TABLET | Freq: Every evening | ORAL | Status: AC

## 2014-07-13 MED ORDER — CLOPIDOGREL BISULFATE 75 MG PO TABS: 75.0000 mg | ORAL_TABLET | Freq: Every day | ORAL | Status: AC

## 2014-07-13 MED ORDER — METOPROLOL TARTRATE 25 MG PO TABS: 25.0000 mg | ORAL_TABLET | Freq: Two times a day (BID) | ORAL | Status: AC

## 2014-07-13 MED ORDER — RAMIPRIL 10 MG PO CAPS: 10.0000 mg | ORAL_CAPSULE | Freq: Every day | ORAL | Status: AC

## 2014-07-13 NOTE — Assessment & Plan Note (Signed)
On statin therapy. We will recheck a lipid and liver profile 

## 2014-07-13 NOTE — Progress Notes (Signed)
07/13/2014 Laura Arnold   05/28/1941  [id number]  Primary Physician No PCP Per Patient Primary Cardiologist: Runell GessJonathan J. Taquanna Borras MD Roseanne RenoFACP,FACC,FAHA, FSCAI   HPI:  The patient is a 73 year old, mildly overweight, divorced Caucasian female, mother of 1, grandmother to 1 grandchild who I last saw one year ago.. She has a history of hypertension, hyperlipidemia and continued tobacco abuse of 1/2 pack per day. She has PVOD status post aorto-bi-femoral bypass grafting with reimplantation of her IMA and bilateral profundoplasty. The claudication improved after that time. She denies chest pain or shortness of breath. A 2D echo in 2007 was normal and Myoview performed September 13, 2010, was nonischemic. Her most recent carotid Dopplers performed January 18, 2011, revealed mild right and moderate left ICA stenosis. 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 . Since that time she denies chest pain, shortness of breath or claudication. She does continue to smoke and is recalcitrant to risk factor modification.     Current Outpatient Prescriptions  Medication Sig Dispense Refill  . aspirin 81 MG tablet Take 81 mg by mouth daily.      . clopidogrel (PLAVIX) 75 MG tablet Take 1 tablet (75 mg total) by mouth daily.  90 tablet  3  . metoprolol tartrate (LOPRESSOR) 25 MG tablet Take 1 tablet (25 mg total) by mouth 2 (two) times daily.  180 tablet  3  . Multiple Vitamin (MULTIVITAMIN) tablet Take 1 tablet by mouth daily.      . ramipril (ALTACE) 10 MG capsule Take 1 capsule (10 mg total) by mouth daily.  90 capsule  3  . simvastatin (ZOCOR) 40 MG tablet Take 1 tablet (40 mg total) by mouth every evening.  90 tablet  3   No current facility-administered medications for this visit.    No Known Allergies  History   Social History  . Marital Status: Divorced    Spouse Name: N/A    Number of Children: N/A  . Years of  Education: N/A   Occupational History  . Not on file.   Social History Main Topics  . Smoking status: Current Every Day Smoker -- 0.50 packs/day    Types: Cigarettes  . Smokeless tobacco: Not on file  . Alcohol Use: Not on file  . Drug Use: Not on file  . Sexual Activity: Not on file   Other Topics Concern  . Not on file   Social History Narrative  . No narrative on file     Review of Systems: General: negative for chills, fever, night sweats or weight changes.  Cardiovascular: negative for chest pain, dyspnea on exertion, edema, orthopnea, palpitations, paroxysmal nocturnal dyspnea or shortness of breath Dermatological: negative for rash Respiratory: negative for cough or wheezing Urologic: negative for hematuria Abdominal: negative for nausea, vomiting, diarrhea, bright red blood per rectum, melena, or hematemesis Neurologic: negative for visual changes, syncope, or dizziness All other systems reviewed and are otherwise negative except as noted above.    Blood pressure 148/78, pulse 56, height 5\' 7"  (1.702 m), weight 141 lb 12.8 oz (64.32 kg).  General appearance: alert and no distress Neck: no adenopathy, no JVD, supple, symmetrical, trachea midline, thyroid not enlarged, symmetric, no tenderness/mass/nodules and loud bilateral carotid and subclavian bruits Lungs: clear to auscultation bilaterally Heart: regular rate and rhythm, S1, S2 normal, no murmur, click, rub or gallop Extremities: extremities normal, atraumatic, no cyanosis or edema  EKG sinus bradycardia 56 without  ST or T wave changes  ASSESSMENT AND PLAN:   HTN (hypertension) Controlled on current medications  HLD (hyperlipidemia) On statin therapy. We will recheck a lipid and liver profile  Carotid artery stenosis Her last carotid Doppler study was performed approximately 2-1/2 years ago that revealed moderate right ICA stenosis. She does have loud bruits bilaterally. She is neurologically asymptomatic.  I'm going to recheck carotid Doppler studies to evaluate for progression of disease.      Runell Gess MD FACP,FACC,FAHA, [facility] 07/13/2014 3:55 PM

## 2014-07-13 NOTE — Patient Instructions (Signed)
  We will see you back in follow up in 1 year with Dr Allyson SabalBerry.   Dr Allyson SabalBerry has ordered: 1. Carotid Duplex- This test is an ultrasound of the carotid arteries in your neck. It looks at blood flow through these arteries that supply the brain with blood. Allow one hour for this exam. There are no restrictions or special instructions.   2. Your physician recommends that you return for a FASTING lipid profile

## 2014-07-13 NOTE — Assessment & Plan Note (Signed)
Her last carotid Doppler study was performed approximately 2-1/2 years ago that revealed moderate right ICA stenosis. She does have loud bruits bilaterally. She is neurologically asymptomatic. I'm going to recheck carotid Doppler studies to evaluate for progression of disease.

## 2014-07-13 NOTE — Assessment & Plan Note (Signed)
Controlled on current medications 

## 2016-03-30 ENCOUNTER — Telehealth: Payer: Self-pay | Admitting: Cardiovascular Disease

## 2016-03-30 NOTE — Telephone Encounter (Signed)
New message      The representative from the insurance company stated has called a couple of times and no-one from the medical record department has gotten back with them regarding the pt.

## 2016-04-02 NOTE — Telephone Encounter (Signed)
Called the number provided in the Patient Calls-it happen to be EMSI spoke with Synetta FailAnita representative there made here aware from looking into our system we have not received a request from them.  Sheliah MendsGave Anita all the correct information  Of where to fax,[employer] phone number,and she will forward a request over.

## 2016-04-07 DEATH — deceased
